# Patient Record
Sex: Male | Born: 1955 | Hispanic: Yes | Marital: Single | Smoking: Current every day smoker
Health system: Southern US, Community
[De-identification: ages and names within clinical notes are randomized; demographics above are authoritative.]

## PROBLEM LIST (undated history)

## (undated) DIAGNOSIS — R7303 Prediabetes: Secondary | ICD-10-CM

---

## 1993-02-10 HISTORY — PX: MENISCUS REPAIR: SHX5179

## 2012-02-11 HISTORY — PX: CARPAL TUNNEL RELEASE: SHX101

## 2017-04-28 ENCOUNTER — Other Ambulatory Visit: Payer: Self-pay | Admitting: Orthopedic Surgery

## 2017-04-28 DIAGNOSIS — M25561 Pain in right knee: Secondary | ICD-10-CM

## 2017-06-08 ENCOUNTER — Other Ambulatory Visit: Payer: Self-pay

## 2017-06-08 ENCOUNTER — Encounter: Payer: Self-pay | Admitting: Emergency Medicine

## 2017-06-08 ENCOUNTER — Ambulatory Visit
Admission: RE | Admit: 2017-06-08 | Discharge: 2017-06-08 | Disposition: A | Discharge status: Home / Self Care | Payer: Self-pay | Source: Ambulatory Visit | Attending: Orthopedic Surgery | Admitting: Orthopedic Surgery

## 2017-06-08 ENCOUNTER — Emergency Department
Admission: EM | Admit: 2017-06-08 | Discharge: 2017-06-09 | Disposition: A | Discharge status: Home / Self Care | Payer: Self-pay | Attending: Emergency Medicine | Admitting: Emergency Medicine

## 2017-06-08 ENCOUNTER — Emergency Department: Payer: Self-pay

## 2017-06-08 DIAGNOSIS — R1013 Epigastric pain: Secondary | ICD-10-CM | POA: Insufficient documentation

## 2017-06-08 DIAGNOSIS — F17210 Nicotine dependence, cigarettes, uncomplicated: Secondary | ICD-10-CM | POA: Insufficient documentation

## 2017-06-08 DIAGNOSIS — S83231A Complex tear of medial meniscus, current injury, right knee, initial encounter: Secondary | ICD-10-CM | POA: Insufficient documentation

## 2017-06-08 DIAGNOSIS — S83411A Sprain of medial collateral ligament of right knee, initial encounter: Secondary | ICD-10-CM | POA: Insufficient documentation

## 2017-06-08 DIAGNOSIS — M25561 Pain in right knee: Secondary | ICD-10-CM | POA: Insufficient documentation

## 2017-06-08 DIAGNOSIS — R110 Nausea: Secondary | ICD-10-CM | POA: Insufficient documentation

## 2017-06-08 DIAGNOSIS — K859 Acute pancreatitis without necrosis or infection, unspecified: Secondary | ICD-10-CM | POA: Insufficient documentation

## 2017-06-08 HISTORY — DX: Prediabetes: R73.03

## 2017-06-08 LAB — HEPATIC FUNCTION PANEL
ALBUMIN: 4 g/dL (ref 3.5–5.0)
ALT: 18 U/L (ref 17–63)
AST: 30 U/L (ref 15–41)
Alkaline Phosphatase: 66 U/L (ref 38–126)
TOTAL PROTEIN: 7.3 g/dL (ref 6.5–8.1)
Total Bilirubin: 0.5 mg/dL (ref 0.3–1.2)

## 2017-06-08 LAB — CBC
HCT: 42.8 % (ref 40.0–52.0)
HEMOGLOBIN: 14.9 g/dL (ref 13.0–18.0)
MCH: 34.5 pg — ABNORMAL HIGH (ref 26.0–34.0)
MCHC: 34.8 g/dL (ref 32.0–36.0)
MCV: 99 fL (ref 80.0–100.0)
PLATELETS: 295 10*3/uL (ref 150–440)
RBC: 4.33 MIL/uL — AB (ref 4.40–5.90)
RDW: 14.4 % (ref 11.5–14.5)
WBC: 9.7 10*3/uL (ref 3.8–10.6)

## 2017-06-08 LAB — BASIC METABOLIC PANEL
ANION GAP: 9 (ref 5–15)
BUN: 16 mg/dL (ref 6–20)
CALCIUM: 8.5 mg/dL — AB (ref 8.9–10.3)
CO2: 26 mmol/L (ref 22–32)
CREATININE: 0.77 mg/dL (ref 0.61–1.24)
Chloride: 104 mmol/L (ref 101–111)
Glucose, Bld: 108 mg/dL — ABNORMAL HIGH (ref 65–99)
Potassium: 4 mmol/L (ref 3.5–5.1)
SODIUM: 139 mmol/L (ref 135–145)

## 2017-06-08 LAB — TROPONIN I

## 2017-06-08 LAB — LIPASE, BLOOD: Lipase: 58 U/L — ABNORMAL HIGH (ref 11–51)

## 2017-06-08 MED ORDER — GI COCKTAIL ~~LOC~~
30.0000 mL | Freq: Once | ORAL | Status: AC
Start: 2017-06-08 — End: 2017-06-08
  Administered 2017-06-08: 30 mL via ORAL
  Filled 2017-06-08: qty 30

## 2017-06-08 NOTE — ED Notes (Signed)
Pt had an MRI done around 3:00 and around 5:00pm he started having nausea and chest pain on left side. Pt states that his heart feels "really warm". Family at bedside. Pt alert and oriented x 4.Pt is spanish speaking, but family at bedside speaks [place].

## 2017-06-08 NOTE — ED Provider Notes (Signed)
[facility] Emergency Department Provider Note  Time seen: 8:27 PM  I have reviewed the triage vital signs and the nursing notes.   HISTORY  Chief Complaint Chest Pain and Nausea    HPI Joel Lutz is a 62 y.o. male with no past medical history who presents to the emergency department today for chest discomfort.  According to the patient he had an MRI performed of the knee around 3 PM today (MRI was without contrast) shortly afterwards he developed a burning sensation to the left chest with occasional sharp pains.  Also states epigastric pain and nausea but denies any vomiting diaphoresis or shortness of breath.  Patient states he has had similar pains in the past but never with this sharp pain which is what concerned him so he came to the emergency department for evaluation.   Past Medical History:  Diagnosis Date  . Pre-diabetes     There are no active problems to display for this patient.   History reviewed. No pertinent surgical history.  Prior to Admission medications   Not on File    No Known Allergies  No family history on file.  Social History Social History   Tobacco Use  . Smoking status: Current Every Day Smoker    Packs/day: 0.50    Types: Cigarettes  . Smokeless tobacco: Never Used  Substance Use Topics  . Alcohol use: Never    Frequency: Never  . Drug use: Never    Review of Systems Constitutional: Negative for fever. Eyes: Negative for visual complaints ENT: Negative for recent illness/congestion Cardiovascular: Mild chest burning Respiratory: Negative for shortness of breath. Gastrointestinal: Positive for epigastric burning.  Positive for nausea.  Negative for vomiting or diarrhea Genitourinary: Negative for urinary compaints Musculoskeletal: Negative for musculoskeletal complaints Skin: Negative for skin complaints  Neurological: Negative for headache All other ROS  negative  ____________________________________________   PHYSICAL EXAM:  VITAL SIGNS: ED Triage Vitals  Enc Vitals Group     BP 06/08/17 1942 129/70     Pulse Rate 06/08/17 1942 60     Resp 06/08/17 1942 18     Temp 06/08/17 1942 98.3 F (36.8 C)     Temp Source 06/08/17 1942 Oral     SpO2 06/08/17 1942 99 %     Weight 06/08/17 1940 145 lb (65.8 kg)     Height 06/08/17 1940  (1.651 m)     Head Circumference --      Peak Flow --      Pain Score 06/08/17 1939 8     Pain Loc --      Pain Edu? --      Excl. in GC? --     Constitutional: Alert. Well appearing and in no distress. Eyes: Normal exam ENT   Head: Normocephalic and atraumatic.   Nose: No congestion/rhinnorhea.   Mouth/Throat: Mucous membranes are moist. Cardiovascular: Normal rate, regular rhythm. No murmurs, rubs, or gallops. Respiratory: Normal respiratory effort without tachypnea nor retractions. Breath sounds are clear and equal bilaterally. No wheezes/rales/rhonchi. Gastrointestinal: Soft, moderate epigastric tenderness.  Abdomen otherwise benign.  No right upper quadrant tenderness.  No rebound or guarding.  No distention. Musculoskeletal: Nontender with normal range of motion in all extremities. No lower extremity tenderness or edema. Neurologic:  No gross focal neurologic deficits are appreciated. Skin:  Skin is warm, dry and intact.  Psychiatric: Mood and affect are normal. Speech and behavior are normal.   ____________________________________________    EKG  EKG  reviewed and interpreted by myself shows normal sinus rhythm at 60 bpm with a narrow QRS, normal axis, normal intervals, no ST changes.  Normal EKG.  ____________________________________________    RADIOLOGY  Chest x-ray negative  ____________________________________________   INITIAL IMPRESSION / ASSESSMENT AND PLAN / ED COURSE  Pertinent labs & imaging results that were available during my care of the patient were  reviewed by me and considered in my medical decision making (see chart for details).  Patient presents to the emergency department for chest burning and epigastric pain with nausea.  On exam patient has moderate epigastric tenderness, largely benign abdomen otherwise.  No rebound or guarding or distention.  Patient denies alcohol use.  Differential would include pancreatitis, ACS, gastric reflux, gastritis or esophagitis, gallbladder disease or hepatitis.  We will check labs, chest x-ray and EKG.  Labs are largely reassuring including negative troponin, x-ray is normal.  EKG is normal.  I have added on a hepatic function panel as well as a lipase.  We will dose a GI cocktail and continue to closely monitor.  Patient agreeable to this plan of care.  We will order a repeat troponin to be drawn 3 hours after initial troponin.  Second troponin is negative.  Lipase is slightly elevated which would explain his epigastric discomfort and burning sensation.  Patient does state somewhat better after GI cocktail but still remains present.  We will discharge with Norco, instructions for clear liquid diet for 2 days followed by low-fat diet for 7 days.  Patient agreeable to plan discussed return precautions as well. ____________________________________________   FINAL CLINICAL IMPRESSION(S) / ED DIAGNOSES  Chest pain Epigastric pain Pancreatitis   Minna Antis, MD 06/09/17 1191

## 2017-06-08 NOTE — ED Triage Notes (Signed)
Pt presents to ED with burning in his chest and nausea with intermittent sharp pains. Onset of symptoms started just after MRI was performed around 1700 today. Pt alert and able to answer questions without difficulty. No distress noted.

## 2017-06-09 LAB — TROPONIN I

## 2017-06-09 MED ORDER — HYDROCODONE-ACETAMINOPHEN 5-325 MG PO TABS: 1.0000 | ORAL_TABLET | ORAL | 0 refills | Status: DC | PRN

## 2017-06-09 NOTE — Discharge Instructions (Signed)
Has been discussed please adhere to a clear liquid diet for the next 2 days.  Then adhere to a very low-fat diet for the next 7 days.  Return to the emergency department for any worsening pain, fever, recurrence of chest pain, or any other symptoms personally concerning to self.  Otherwise please follow-up with your doctor within the next 2 days for recheck/reevaluation.  Please take your pain medication as needed but only as prescribed.

## 2017-06-29 ENCOUNTER — Other Ambulatory Visit: Payer: Self-pay | Admitting: Orthopedic Surgery

## 2017-06-30 ENCOUNTER — Inpatient Hospital Stay: Admission: RE | Admit: 2017-06-30 | Payer: Self-pay | Source: Ambulatory Visit

## 2017-07-01 ENCOUNTER — Encounter
Admission: RE | Admit: 2017-07-01 | Discharge: 2017-07-01 | Disposition: A | Discharge status: Home / Self Care | Payer: Self-pay | Source: Ambulatory Visit | Attending: Orthopedic Surgery | Admitting: Orthopedic Surgery

## 2017-07-01 ENCOUNTER — Other Ambulatory Visit: Payer: Self-pay

## 2017-07-01 DIAGNOSIS — Z01812 Encounter for preprocedural laboratory examination: Secondary | ICD-10-CM | POA: Insufficient documentation

## 2017-07-01 LAB — PROTIME-INR
INR: 0.87
Prothrombin Time: 11.7 seconds (ref 11.4–15.2)

## 2017-07-01 LAB — APTT: APTT: 31 s (ref 24–36)

## 2017-07-01 NOTE — Patient Instructions (Signed)
Your procedure is scheduled on: Tuesday 07/07/17 Su procedimiento est programado para: Report to  Presntese a: To find out your arrival time please call [phone] between 1PM - 3PM on Friday 07/02/17. Para saber su hora de llegada por favor llame al [phone] entre la 1PM - 3PM el da:  Remember: Instructions that are not followed completely may result in serious medical risk, up to and including death, or upon the discretion of your surgeon and anesthesiologist your surgery may need to be rescheduled.  Recuerde: Las instrucciones que no se siguen completamente Armed forces logistics/support/administrative officer en un riesgo de salud grave, incluyendo hasta la [place] o a discrecin de su cirujano y Scientific laboratory technician, su ciruga se puede posponer.   __X__ 1. Do not eat food or drink liquids after midnight. No gum chewing or hard candies.  No coma alimentos ni tome lquidos despus de la medianoche.  No mastique chicle ni caramelos  duros.     __X__ 2. No alcohol for 24 hours before or after surgery.    No tome alcohol durante las 24 horas antes ni despus de la [place].   __X__ 3. Bring all medications with you on the day of surgery if instructed.    Lleve todos los medicamentos con usted el da de su ciruga si se le ha indicado as.   __X__ 4. Notify your doctor if there is any change in your medical condition (cold, fever,                             infections).    Informe a su mdico si hay algn cambio en su condicin mdica (resfriado, fiebre, infecciones).   Do not wear jewelry, make-up, hairpins, clips or nail polish.  No use joyas, maquillajes, pinzas/ganchos para el cabello ni esmalte de uas.  Do not wear lotions, powders, or perfumes. .  No use lociones, polvos o perfumes.  .    Do not shave 48 hours prior to surgery. Men may shave face and neck.  No se afeite 48 horas antes de la [place].  Los hombres pueden [employer] cara y el cuello.   Do not bring valuables to the hospital.   No lleve objetos de  valor al hospital.  [facility] is not responsible for any belongings or valuables.   no se hace responsable de ningn tipo de pertenencias u objetos de Licensed conveyancer.               Contacts, dentures or bridgework may not be worn into surgery.  Los lentes de [place], las dentaduras postizas o puentes no se pueden usar en la [place].  Leave your suitcase in the car. After surgery it may be brought to your room.  Deje su maleta en el auto.  Despus de la ciruga podr traerla a su habitacin.  For patients admitted to the hospital, discharge time is determined by your treatment team.  Para los pacientes que sean ingresados al hospital, el tiempo en el cual se le dar de alta es determinado por su                equipo de [place].   Patients discharged the day of surgery will not be allowed to drive home. A los pacientes que se les da de alta el mismo da de la ciruga no se les permitir conducir a Higher education careers adviser.   Please read over the following fact sheets that you were given: Por favor lea  las siguientes hojas de informacin que le dieron:   MRSA Information   __X__ Take these medicines the morning of surgery with A SIP OF WATER:          [employer] estas medicinas la maana de la ciruga con UN SORBO DE AGUA:  1. NONE  2.   3.   4.       5.  6.  ____ Fleet Enema (as directed)          Enema de Fleet (segn lo indicado)    __X__ Use CHG Soap as directed          Utilice el jabn de CHG segn lo indicado  ____ Use inhalers on the day of surgery          Use los inhaladores el da de la ciruga  ____ Stop metformin 2 days prior to surgery          Deje de tomar el metformin 2 das antes de la ciruga    ____ Take 1/2 of usual insulin dose the night before surgery and none on the morning of surgery           Tome la mitad de la dosis habitual de insulina la noche antes de la [place] y no tome nada en la maana de la             ciruga  ____ Stop Coumadin/Plavix/aspirin on            Deje de tomar el Coumadin/Plavix/aspirina el da:  ____ Stop Anti-inflammatories on           Deje de tomar antiinflamatorios el da:   ____ Stop supplements until after surgery            Deje de tomar suplementos hasta despus de la ciruga  ____ Bring C-Pap to the hospital          Lleve el C-Pap al hospital

## 2017-07-06 MED ORDER — CEFAZOLIN SODIUM-DEXTROSE 2-4 GM/100ML-% IV SOLN
2.0000 g | INTRAVENOUS | Status: AC
  Administered 2017-07-07: 2 g via INTRAVENOUS

## 2017-07-07 ENCOUNTER — Ambulatory Visit
Admission: RE | Admit: 2017-07-07 | Discharge: 2017-07-07 | Disposition: A | Discharge status: Home / Self Care | Payer: Self-pay | Source: Ambulatory Visit | Attending: Orthopedic Surgery | Admitting: Orthopedic Surgery

## 2017-07-07 ENCOUNTER — Ambulatory Visit: Payer: Self-pay | Admitting: Anesthesiology

## 2017-07-07 ENCOUNTER — Encounter
Admission: RE | Disposition: A | Discharge status: Home / Self Care | Payer: Self-pay | Source: Ambulatory Visit | Attending: Orthopedic Surgery

## 2017-07-07 ENCOUNTER — Encounter: Payer: Self-pay | Admitting: *Deleted

## 2017-07-07 DIAGNOSIS — X58XXXA Exposure to other specified factors, initial encounter: Secondary | ICD-10-CM | POA: Insufficient documentation

## 2017-07-07 DIAGNOSIS — S83241A Other tear of medial meniscus, current injury, right knee, initial encounter: Secondary | ICD-10-CM | POA: Insufficient documentation

## 2017-07-07 DIAGNOSIS — F17210 Nicotine dependence, cigarettes, uncomplicated: Secondary | ICD-10-CM | POA: Insufficient documentation

## 2017-07-07 HISTORY — PX: KNEE ARTHROSCOPY WITH MEDIAL MENISECTOMY: SHX5651

## 2017-07-07 LAB — GLUCOSE, CAPILLARY
GLUCOSE-CAPILLARY: 94 mg/dL (ref 65–99)
Glucose-Capillary: 111 mg/dL — ABNORMAL HIGH (ref 65–99)

## 2017-07-07 SURGERY — ARTHROSCOPY, KNEE, WITH MEDIAL MENISCECTOMY
Anesthesia: General | Site: Knee | Laterality: Right | Wound class: Clean

## 2017-07-07 MED ORDER — CHLORHEXIDINE GLUCONATE CLOTH 2 % EX PADS: 6.0000 | MEDICATED_PAD | Freq: Once | CUTANEOUS | Status: DC

## 2017-07-07 MED ORDER — MIDAZOLAM HCL 2 MG/2ML IJ SOLN
INTRAMUSCULAR | Status: AC
  Filled 2017-07-07: qty 2

## 2017-07-07 MED ORDER — HYDROCODONE-ACETAMINOPHEN 5-325 MG PO TABS
1.0000 | ORAL_TABLET | Freq: Four times a day (QID) | ORAL | Status: DC | PRN
  Administered 2017-07-07: 1 via ORAL

## 2017-07-07 MED ORDER — LIDOCAINE HCL (PF) 1 % IJ SOLN
INTRAMUSCULAR | Status: DC | PRN
  Administered 2017-07-07: 5 mL

## 2017-07-07 MED ORDER — DEXAMETHASONE SODIUM PHOSPHATE 10 MG/ML IJ SOLN
INTRAMUSCULAR | Status: DC | PRN
  Administered 2017-07-07: 5 mg via INTRAVENOUS

## 2017-07-07 MED ORDER — IPRATROPIUM-ALBUTEROL 0.5-2.5 (3) MG/3ML IN SOLN
3.0000 mL | Freq: Once | RESPIRATORY_TRACT | Status: AC
  Administered 2017-07-07: 3 mL via RESPIRATORY_TRACT

## 2017-07-07 MED ORDER — ONDANSETRON HCL 4 MG/2ML IJ SOLN
INTRAMUSCULAR | Status: DC | PRN
  Administered 2017-07-07: 4 mg via INTRAVENOUS

## 2017-07-07 MED ORDER — ACETAMINOPHEN 10 MG/ML IV SOLN
INTRAVENOUS | Status: DC | PRN
  Administered 2017-07-07: 1000 mg via INTRAVENOUS

## 2017-07-07 MED ORDER — FENTANYL CITRATE (PF) 100 MCG/2ML IJ SOLN
INTRAMUSCULAR | Status: AC
  Filled 2017-07-07: qty 2

## 2017-07-07 MED ORDER — ONDANSETRON HCL 4 MG/2ML IJ SOLN: 4.0000 mg | Freq: Once | INTRAMUSCULAR | Status: DC | PRN

## 2017-07-07 MED ORDER — PHENYLEPHRINE HCL 10 MG/ML IJ SOLN
INTRAMUSCULAR | Status: AC
  Filled 2017-07-07: qty 1

## 2017-07-07 MED ORDER — PROPOFOL 10 MG/ML IV BOLUS
INTRAVENOUS | Status: AC
  Filled 2017-07-07: qty 20

## 2017-07-07 MED ORDER — ACETAMINOPHEN 10 MG/ML IV SOLN
INTRAVENOUS | Status: AC
  Filled 2017-07-07: qty 100

## 2017-07-07 MED ORDER — LIDOCAINE HCL (CARDIAC) PF 100 MG/5ML IV SOSY
PREFILLED_SYRINGE | INTRAVENOUS | Status: DC | PRN
  Administered 2017-07-07: 100 mg via INTRAVENOUS

## 2017-07-07 MED ORDER — LACTATED RINGERS IV SOLN
INTRAVENOUS | Status: DC
  Administered 2017-07-07: 75 mL/h via INTRAVENOUS

## 2017-07-07 MED ORDER — BUPIVACAINE-EPINEPHRINE (PF) 0.25% -1:200000 IJ SOLN
INTRAMUSCULAR | Status: DC | PRN
  Administered 2017-07-07: 20 mL via PERINEURAL

## 2017-07-07 MED ORDER — BUPIVACAINE-EPINEPHRINE (PF) 0.25% -1:200000 IJ SOLN
INTRAMUSCULAR | Status: AC
  Filled 2017-07-07: qty 30

## 2017-07-07 MED ORDER — IPRATROPIUM-ALBUTEROL 0.5-2.5 (3) MG/3ML IN SOLN
RESPIRATORY_TRACT | Status: AC
  Filled 2017-07-07: qty 3

## 2017-07-07 MED ORDER — MIDAZOLAM HCL 2 MG/2ML IJ SOLN
INTRAMUSCULAR | Status: DC | PRN
  Administered 2017-07-07: 2 mg via INTRAVENOUS

## 2017-07-07 MED ORDER — SUGAMMADEX SODIUM 200 MG/2ML IV SOLN
INTRAVENOUS | Status: DC | PRN
  Administered 2017-07-07: 200 mg via INTRAVENOUS

## 2017-07-07 MED ORDER — ASPIRIN EC 325 MG PO TBEC: 325.0000 mg | DELAYED_RELEASE_TABLET | Freq: Every day | ORAL | 0 refills | Status: AC

## 2017-07-07 MED ORDER — SUCCINYLCHOLINE CHLORIDE 20 MG/ML IJ SOLN
INTRAMUSCULAR | Status: AC
  Filled 2017-07-07: qty 1

## 2017-07-07 MED ORDER — FAMOTIDINE 20 MG PO TABS
20.0000 mg | ORAL_TABLET | Freq: Once | ORAL | Status: AC
  Administered 2017-07-07: 20 mg via ORAL

## 2017-07-07 MED ORDER — DEXAMETHASONE SODIUM PHOSPHATE 10 MG/ML IJ SOLN
INTRAMUSCULAR | Status: AC
  Filled 2017-07-07: qty 1

## 2017-07-07 MED ORDER — ROCURONIUM BROMIDE 100 MG/10ML IV SOLN
INTRAVENOUS | Status: DC | PRN
  Administered 2017-07-07: 30 mg via INTRAVENOUS
  Administered 2017-07-07: 20 mg via INTRAVENOUS

## 2017-07-07 MED ORDER — SUGAMMADEX SODIUM 200 MG/2ML IV SOLN
INTRAVENOUS | Status: AC
  Filled 2017-07-07: qty 2

## 2017-07-07 MED ORDER — PHENYLEPHRINE HCL 10 MG/ML IJ SOLN
INTRAMUSCULAR | Status: DC | PRN
  Administered 2017-07-07 (×5): 100 ug via INTRAVENOUS

## 2017-07-07 MED ORDER — SUCCINYLCHOLINE CHLORIDE 20 MG/ML IJ SOLN
INTRAMUSCULAR | Status: DC | PRN
  Administered 2017-07-07: 100 mg via INTRAVENOUS

## 2017-07-07 MED ORDER — FENTANYL CITRATE (PF) 100 MCG/2ML IJ SOLN
INTRAMUSCULAR | Status: DC | PRN
  Administered 2017-07-07 (×2): 50 ug via INTRAVENOUS

## 2017-07-07 MED ORDER — ROCURONIUM BROMIDE 50 MG/5ML IV SOLN
INTRAVENOUS | Status: AC
  Filled 2017-07-07: qty 1

## 2017-07-07 MED ORDER — HYDROCODONE-ACETAMINOPHEN 5-325 MG PO TABS: 1.0000 | ORAL_TABLET | Freq: Four times a day (QID) | ORAL | 0 refills | Status: AC | PRN

## 2017-07-07 MED ORDER — LIDOCAINE HCL (PF) 2 % IJ SOLN
INTRAMUSCULAR | Status: AC
  Filled 2017-07-07: qty 10

## 2017-07-07 MED ORDER — ONDANSETRON HCL 4 MG PO TABS: 4.0000 mg | ORAL_TABLET | Freq: Three times a day (TID) | ORAL | 0 refills | Status: AC | PRN

## 2017-07-07 MED ORDER — HYDROCODONE-ACETAMINOPHEN 5-325 MG PO TABS
ORAL_TABLET | ORAL | Status: AC
  Filled 2017-07-07: qty 1

## 2017-07-07 MED ORDER — ONDANSETRON HCL 4 MG/2ML IJ SOLN
INTRAMUSCULAR | Status: AC
  Filled 2017-07-07: qty 2

## 2017-07-07 MED ORDER — LIDOCAINE HCL (PF) 1 % IJ SOLN
INTRAMUSCULAR | Status: AC
  Filled 2017-07-07: qty 30

## 2017-07-07 MED ORDER — FAMOTIDINE 20 MG PO TABS
ORAL_TABLET | ORAL | Status: AC
  Administered 2017-07-07: 20 mg via ORAL
  Filled 2017-07-07: qty 1

## 2017-07-07 MED ORDER — FENTANYL CITRATE (PF) 100 MCG/2ML IJ SOLN: 25.0000 ug | INTRAMUSCULAR | Status: DC | PRN

## 2017-07-07 MED ORDER — PROPOFOL 10 MG/ML IV BOLUS
INTRAVENOUS | Status: DC | PRN
  Administered 2017-07-07: 80 mg via INTRAVENOUS
  Administered 2017-07-07: 120 mg via INTRAVENOUS

## 2017-07-07 SURGICAL SUPPLY — 32 items
BLADE SURG SZ10 CARB STEEL (BLADE) ×3 IMPLANT
BUR RADIUS 3.5 (BURR) ×3 IMPLANT
BUR RADIUS 4.0X18.5 (BURR) ×3 IMPLANT
CLOSURE WOUND 1/2 X4 (GAUZE/BANDAGES/DRESSINGS) ×1
CUFF TOURN 24 STER (MISCELLANEOUS) ×3 IMPLANT
DRAPE IMP U-DRAPE 54X76 (DRAPES) ×3 IMPLANT
DURAPREP 26ML APPLICATOR (WOUND CARE) ×9 IMPLANT
GAUZE PETRO XEROFOAM 1X8 (MISCELLANEOUS) ×3 IMPLANT
GAUZE SPONGE 4X4 12PLY STRL (GAUZE/BANDAGES/DRESSINGS) ×3 IMPLANT
GLOVE BIOGEL PI IND STRL 9 (GLOVE) ×1 IMPLANT
GLOVE BIOGEL PI INDICATOR 9 (GLOVE) ×2
GLOVE SURG 9.0 ORTHO LTXF (GLOVE) ×6 IMPLANT
GOWN STRL REUS W/ TWL LRG LVL3 (GOWN DISPOSABLE) ×1 IMPLANT
GOWN STRL REUS W/TWL 2XL LVL3 (GOWN DISPOSABLE) ×3 IMPLANT
GOWN STRL REUS W/TWL LRG LVL3 (GOWN DISPOSABLE) ×2
IV LACTATED RINGER IRRG 3000ML (IV SOLUTION) ×12
IV LR IRRIG 3000ML ARTHROMATIC (IV SOLUTION) ×6 IMPLANT
KIT TURNOVER KIT A (KITS) ×3 IMPLANT
MANIFOLD NEPTUNE II (INSTRUMENTS) ×3 IMPLANT
MAT GRAY ABSORB FLUID 28X50 (MISCELLANEOUS) ×3 IMPLANT
PACK ARTHROSCOPY KNEE (MISCELLANEOUS) ×3 IMPLANT
PAD ABD DERMACEA PRESS 5X9 (GAUZE/BANDAGES/DRESSINGS) ×6 IMPLANT
SET TUBE SUCT SHAVER OUTFL 24K (TUBING) ×3 IMPLANT
SOL PREP PVP 2OZ (MISCELLANEOUS) ×3
SOLUTION PREP PVP 2OZ (MISCELLANEOUS) ×1 IMPLANT
STRIP CLOSURE SKIN 1/2X4 (GAUZE/BANDAGES/DRESSINGS) ×2 IMPLANT
SUT ETHILON 4-0 (SUTURE) ×2
SUT ETHILON 4-0 FS2 18XMFL BLK (SUTURE) ×1
SUTURE ETHLN 4-0 FS2 18XMF BLK (SUTURE) ×1 IMPLANT
TUBING ARTHRO INFLOW-ONLY STRL (TUBING) ×3 IMPLANT
WAND HAND CNTRL MULTIVAC 50 (MISCELLANEOUS) ×3 IMPLANT
WAND HAND CNTRL MULTIVAC 90 (MISCELLANEOUS) ×3 IMPLANT

## 2017-07-07 NOTE — OR Nursing (Addendum)
Patient states he has one loose tooth on top of mouth. Denies any broken teeth. Preop interpretation provided by [facility].

## 2017-07-07 NOTE — Anesthesia Procedure Notes (Signed)
Procedure Name: Intubation Date/Time: 07/07/2017 8:01 AM Performed by: Aline Brochure, CRNA Pre-anesthesia Checklist: Patient identified, Emergency Drugs available, Suction available and Patient being monitored Patient Re-evaluated:Patient Re-evaluated prior to induction Oxygen Delivery Method: Circle system utilized Preoxygenation: Pre-oxygenation with 100% oxygen Induction Type: IV induction Ventilation: Mask ventilation without difficulty Laryngoscope Size: Mac and 4 Grade View: Grade II Tube type: Oral Tube size: 7.5 mm Number of attempts: 1 Placement Confirmation: ETT inserted through vocal cords under direct vision,  positive ETCO2 and breath sounds checked- equal and bilateral Secured at: 23 cm Tube secured with: Tape Dental Injury: Teeth and Oropharynx as per pre-operative assessment  Difficulty Due To: Difficult Airway- due to dentition

## 2017-07-07 NOTE — Transfer of Care (Signed)
Immediate Anesthesia Transfer of Care Note  Patient: Joel Lutz  Procedure(s) Performed: KNEE ARTHROSCOPY WITH MEDIAL MENISECTOMY (Right Knee)  Patient Location: PACU  Anesthesia Type:General  Level of Consciousness: awake  Airway & Oxygen Therapy: Patient connected to face mask oxygen  Post-op Assessment: Post -op Vital signs reviewed and stable  Post vital signs: stable  Last Vitals:  Vitals Value Taken Time  BP 129/73 07/07/2017  9:37 AM  Temp    Pulse 91 07/07/2017  9:37 AM  Resp 20 07/07/2017  9:37 AM  SpO2 100 % 07/07/2017  9:37 AM    Last Pain:  Vitals:   07/07/17 0619  TempSrc: Tympanic  PainSc: 4       Patients Stated Pain Goal: 1 (07/07/17 1610)  Complications: No apparent anesthesia complications

## 2017-07-07 NOTE — Op Note (Signed)
  PATIENT:  Joel Lutz  PRE-OPERATIVE DIAGNOSIS:  TEAR OF MEDIAL MENISCUS, RIGHT KNEE  POST-OPERATIVE DIAGNOSIS:  Same  PROCEDURE:  RIGHT KNEE ARTHROSCOPY WITH  Partial MEDIAL MENISECTOMY, synovectomy  SURGEON:  Thornton Park, MD  ANESTHESIA:   General  PREOPERATIVE INDICATIONS:  Joel Lutz  62 y.o. male with a diagnosis of [place] has elected for surgical management.    The risks benefits and alternatives were discussed with the patient preoperatively including the risks of infection, bleeding, nerve injury, knee stiffness, persistent pain, osteoarthritis and the need for further surgery. Medical  risks include DVT and pulmonary embolism, myocardial infarction, stroke, pneumonia, respiratory failure and death. The patient understood these risks and wished to proceed.  OPERATIVE FINDINGS: Undersurface tear of the posterior medial meniscus  OPERATIVE PROCEDURE: Patient was met in the preoperative area. The operative extremity was signed with the word yes and my initials according the hospital's correct site of surgery protocol.  A pre-op H&P was performed at the bedside this AM, with the patient's family and hospital interpreter at the bedside.  The patient was brought to the operating room where they was placed supine on the operative table. General anesthesia was administered. The patient was prepped and draped in a sterile fashion.  A timeout was performed to verify the patient's name, date of birth, medical record number, correct site of surgery correct procedure to be performed. It was also used to verify the patient received antibiotics that all appropriate instruments, and radiographic studies were available in the room. Once all in attendance were in agreement, the case began.  Proposed arthroscopy incisions were drawn out with a surgical marker. These were pre-injected with 1% lidocaine plain. An 11 blade was used to establish an  inferior lateral and inferomedial portals. The inferomedial portal was created using a 18-gauge spinal needle under direct visualization.  A full diagnostic examination of the knee was performed including the suprapatellar pouch, patellofemoral joint, medial lateral compartments as well as the medial lateral gutters, the intercondylar notch in the posterior knee.  Patient had the medial meniscal tear treated with a 4.0 resector shaver blade and straight duckbill basket. The undersurface of the meniscal tear was debrided leaving a stable articular surface.  A partial synovectomy was also performed using a 4.0 resector shaver blade and 90 ArthroCare wand.  The knee was then copiously lavaged. All arthroscopic instruments were removed. The two arthroscopy portals were closed with 4.0 nylon. Steri-Strips were applied along with a dry sterile and compressive dressing. The patient was brought to the PACU in stable condition. I was scrubbed and present for the entire case and all sharp and instrument counts were correct at the conclusion the case. I spoke with the patient's family postoperatively to let them know the case was performed without complication and the patient was stable in the recovery room.   Timoteo Gaul, MD

## 2017-07-07 NOTE — Anesthesia Postprocedure Evaluation (Signed)
Anesthesia Post Note  Patient: Eliezer Khawaja  Procedure(s) Performed: KNEE ARTHROSCOPY WITH MEDIAL MENISECTOMY (Right Knee)  Patient location during evaluation: PACU Anesthesia Type: General Level of consciousness: awake and alert and oriented Pain management: pain level controlled Vital Signs Assessment: post-procedure vital signs reviewed and stable Respiratory status: spontaneous breathing Cardiovascular status: blood pressure returned to baseline Anesthetic complications: no Comments: Sister admits that the patient is just getting over a cold after previously denying any recent cold preop     Last Vitals:  Vitals:   07/07/17 0950 07/07/17 1005  BP: 133/76 (!) 142/80  Pulse: 90 83  Resp: 10 12  Temp:    SpO2: 94% 93%    Last Pain:  Vitals:   07/07/17 1006  TempSrc:   PainSc: 0-No pain                 Melonee Gerstel

## 2017-07-07 NOTE — H&P (Signed)
PREOPERATIVE H&P  Chief Complaint: tear of meniscus of right knee  HPI: Joel Lutz is a 62 y.o. male who presents for preoperative history and physical with a diagnosis of tear of medial meniscus of right knee. Symptoms are rated as moderate, and have been worsening.  This is significantly impairing activities of daily living.  He has elected for surgical management.   Past Medical History:  Diagnosis Date  . Pre-diabetes    Past Surgical History:  Procedure Laterality Date  . CARPAL TUNNEL RELEASE Right 2014  . MENISCUS REPAIR Left 1995   Social History   Socioeconomic History  . Marital status: Single    Spouse name: Not on file  . Number of children: Not on file  . Years of education: Not on file  . Highest education level: Not on file  Occupational History  . Not on file  Social Needs  . Financial resource strain: Not on file  . Food insecurity:    Worry: Not on file    Inability: Not on file  . Transportation needs:    Medical: Not on file    Non-medical: Not on file  Tobacco Use  . Smoking status: Current Every Day Smoker    Packs/day: 0.25    Types: Cigarettes  . Smokeless tobacco: Never Used  Substance and Sexual Activity  . Alcohol use: Yes    Frequency: Never    Comment: occasional beer  . Drug use: Never  . Sexual activity: Not on file  Lifestyle  . Physical activity:    Days per week: Not on file    Minutes per session: Not on file  . Stress: Not on file  Relationships  . Social connections:    Talks on phone: Not on file    Gets together: Not on file    Attends religious service: Not on file    Active member of club or organization: Not on file    Attends meetings of clubs or organizations: Not on file    Relationship status: Not on file  Other Topics Concern  . Not on file  Social History Narrative  . Not on file   History reviewed. No pertinent family history. No Known Allergies Prior to Admission medications   Not on  File     Positive ROS: All other systems have been reviewed and were otherwise negative with the exception of those mentioned in the HPI and as above.  Physical Exam: General: Alert, no acute distress Cardiovascular: Regular rate and rhythm, no murmurs rubs or gallops.  No pedal edema Respiratory: Clear to auscultation bilaterally, no wheezes rales or rhonchi. No cyanosis, no use of accessory musculature GI: No organomegaly, abdomen is soft and non-tender nondistended with positive bowel sounds. Skin: Skin intact, no lesions within the operative field. Neurologic: Sensation intact distally Psychiatric: Patient is competent for consent with normal mood and affect Lymphatic: No cervical lymphadenopathy  MUSCULOSKELETAL: Right knee:  + medial joint line tenderness,  + Mcmurrays, no ligatmentous laxity.  NVI  Assessment: tear of meniscus of right knee  Plan: Plan for Procedure(s): RIGHT KNEE ARTHROSCOPY WITH MEDIAL MENISECTOMY  I have explained to the patient and his family the details of the operation and the post-op course with the help of the hospital provided interpreter.  I discussed the risks and benefits of surgery. They understand risks include but are not limited to infection, bleeding requiring blood transfusion, nerve or blood vessel injury, joint stiffness or loss of motion, persistent pain, weakness or  instability, and the need for further surgery. Medical risks include but are not limited to DVT and pulmonary embolism, myocardial infarction, stroke, pneumonia, respiratory failure and death. Patient understood these risks and wished to proceed.   Juanell Fairly, MD   07/07/2017 7:44 AM

## 2017-07-07 NOTE — Discharge Instructions (Signed)
CIRUGIA AMBULATORIA °      Instruccionnes de alta ° ° °1.  Las drogas que se le administraron permaneceran en su cuerpo hasta [place], asi       °     que por las proximas 24 horas usted no debe: °  Conducir (manejar) un automovil °  Hacer ninguna decision legal °  Tomar ninguna bebida alcoholica ° °2.  A) [place] puede comenzar una dieta regular.  Es mejor que hoy empiece con  °         liquidos y gradualmente anada comidas solidas. ° °     B) Puede comer cualquier comida que desee pero es mejor empezar con liquidos,             °         luego sopitas con galletas saladas y gradualmente llegar a las comidas solidas. ° °3.  Por favor avise a su medico inmediatamente si usted tiene algun sangrado anormal,  °     tiene dificultad con la respiracion, enrojecimiento y dolor en el sitio de la cirugia, drenaje,  °     fiebro o dolor que se alivia con medicina. ° ° °       ° °5.  Istrucciones especificas :  °

## 2017-07-07 NOTE — Anesthesia Post-op Follow-up Note (Signed)
Anesthesia QCDR form completed.        

## 2017-07-07 NOTE — Anesthesia Preprocedure Evaluation (Signed)
Anesthesia Evaluation  Patient identified by MRN, date of birth, ID band Patient awake    Reviewed: Allergy & Precautions, NPO status , Patient's Chart, lab work & pertinent test results  Airway Mallampati: II  TM Distance: >3 FB     Dental  (+) Loose, Poor Dentition   Pulmonary Current Smoker,    Pulmonary exam normal        Cardiovascular negative cardio ROS Normal cardiovascular exam     Neuro/Psych negative neurological ROS  negative psych ROS   GI/Hepatic negative GI ROS, Neg liver ROS,   Endo/Other  diabetes  Renal/GU negative Renal ROS  negative genitourinary   Musculoskeletal   Abdominal Normal abdominal exam  (+)   Peds negative pediatric ROS (+)  Hematology negative hematology ROS (+)   Anesthesia Other Findings Past Medical History: No date: Pre-diabetes  Reproductive/Obstetrics                             Anesthesia Physical Anesthesia Plan  ASA: II  Anesthesia Plan: General   Post-op Pain Management:    Induction: Intravenous  PONV Risk Score and Plan:   Airway Management Planned: LMA and Oral ETT  Additional Equipment:   Intra-op Plan:   Post-operative Plan: Extubation in OR  Informed Consent: I have reviewed the patients History and Physical, chart, labs and discussed the procedure including the risks, benefits and alternatives for the proposed anesthesia with the patient or authorized representative who has indicated his/her understanding and acceptance.   Dental advisory given  Plan Discussed with: CRNA and Surgeon  Anesthesia Plan Comments:         Anesthesia Quick Evaluation

## 2017-07-08 ENCOUNTER — Encounter: Payer: Self-pay | Admitting: Emergency Medicine

## 2017-07-08 ENCOUNTER — Emergency Department
Admission: EM | Admit: 2017-07-08 | Discharge: 2017-07-08 | Disposition: A | Discharge status: Home / Self Care | Payer: Self-pay | Attending: Emergency Medicine | Admitting: Emergency Medicine

## 2017-07-08 ENCOUNTER — Emergency Department: Payer: Self-pay

## 2017-07-08 ENCOUNTER — Other Ambulatory Visit: Payer: Self-pay

## 2017-07-08 DIAGNOSIS — M791 Myalgia, unspecified site: Secondary | ICD-10-CM | POA: Insufficient documentation

## 2017-07-08 DIAGNOSIS — R079 Chest pain, unspecified: Secondary | ICD-10-CM | POA: Insufficient documentation

## 2017-07-08 DIAGNOSIS — R51 Headache: Secondary | ICD-10-CM | POA: Insufficient documentation

## 2017-07-08 DIAGNOSIS — R109 Unspecified abdominal pain: Secondary | ICD-10-CM | POA: Insufficient documentation

## 2017-07-08 DIAGNOSIS — R070 Pain in throat: Secondary | ICD-10-CM | POA: Insufficient documentation

## 2017-07-08 DIAGNOSIS — G8918 Other acute postprocedural pain: Secondary | ICD-10-CM | POA: Insufficient documentation

## 2017-07-08 DIAGNOSIS — F17210 Nicotine dependence, cigarettes, uncomplicated: Secondary | ICD-10-CM | POA: Insufficient documentation

## 2017-07-08 LAB — COMPREHENSIVE METABOLIC PANEL
ALBUMIN: 4 g/dL (ref 3.5–5.0)
ALT: 18 U/L (ref 17–63)
AST: 36 U/L (ref 15–41)
Alkaline Phosphatase: 68 U/L (ref 38–126)
Anion gap: 7 (ref 5–15)
BILIRUBIN TOTAL: 0.4 mg/dL (ref 0.3–1.2)
BUN: 14 mg/dL (ref 6–20)
CHLORIDE: 104 mmol/L (ref 101–111)
CO2: 27 mmol/L (ref 22–32)
CREATININE: 0.89 mg/dL (ref 0.61–1.24)
Calcium: 8.7 mg/dL — ABNORMAL LOW (ref 8.9–10.3)
GFR calc Af Amer: >60 mL/min (ref >60)
GFR calc non Af Amer: >60 mL/min (ref >60)
GLUCOSE: 97 mg/dL (ref 65–99)
Potassium: 3.8 mmol/L (ref 3.5–5.1)
Sodium: 138 mmol/L (ref 135–145)
TOTAL PROTEIN: 7.5 g/dL (ref 6.5–8.1)

## 2017-07-08 LAB — CBC WITH DIFFERENTIAL/PLATELET
BASOS ABS: 0.1 10*3/uL (ref 0–0.1)
BASOS PCT: 1 %
Eosinophils Absolute: 0 10*3/uL (ref 0–0.7)
Eosinophils Relative: 0 %
HEMATOCRIT: 42 % (ref 40.0–52.0)
HEMOGLOBIN: 14.6 g/dL (ref 13.0–18.0)
LYMPHS PCT: 16 %
Lymphs Abs: 2.4 10*3/uL (ref 1.0–3.6)
MCH: 34.6 pg — ABNORMAL HIGH (ref 26.0–34.0)
MCHC: 34.7 g/dL (ref 32.0–36.0)
MCV: 99.5 fL (ref 80.0–100.0)
Monocytes Absolute: 1.7 10*3/uL — ABNORMAL HIGH (ref 0.2–1.0)
Monocytes Relative: 11 %
NEUTROS ABS: 10.9 10*3/uL — AB (ref 1.4–6.5)
NEUTROS PCT: 72 %
Platelets: 344 10*3/uL (ref 150–440)
RBC: 4.22 MIL/uL — AB (ref 4.40–5.90)
RDW: 13.8 % (ref 11.5–14.5)
WBC: 15.1 10*3/uL — AB (ref 3.8–10.6)

## 2017-07-08 LAB — URINALYSIS, COMPLETE (UACMP) WITH MICROSCOPIC
BACTERIA UA: NONE SEEN
BILIRUBIN URINE: NEGATIVE
Glucose, UA: NEGATIVE mg/dL
KETONES UR: NEGATIVE mg/dL
Leukocytes, UA: NEGATIVE
Nitrite: NEGATIVE
Protein, ur: NEGATIVE mg/dL
SQUAMOUS EPITHELIAL / LPF: NONE SEEN (ref 0–5)
Specific Gravity, Urine: 1.011 (ref 1.005–1.030)
pH: 6 (ref 5.0–8.0)

## 2017-07-08 LAB — TROPONIN I: Troponin I: <0.03 ng/mL (ref <0.03)

## 2017-07-08 MED ORDER — GI COCKTAIL ~~LOC~~
ORAL | Status: AC
  Administered 2017-07-08: 30 mL via ORAL
  Filled 2017-07-08: qty 30

## 2017-07-08 MED ORDER — DOXYCYCLINE HYCLATE 100 MG PO CAPS: 100.0000 mg | ORAL_CAPSULE | Freq: Two times a day (BID) | ORAL | 0 refills | Status: DC

## 2017-07-08 MED ORDER — GI COCKTAIL ~~LOC~~
30.0000 mL | Freq: Once | ORAL | Status: AC
  Administered 2017-07-08: 30 mL via ORAL

## 2017-07-08 NOTE — ED Notes (Signed)
Patient transported to X-ray 

## 2017-07-08 NOTE — ED Notes (Signed)
Interpreter paged.

## 2017-07-08 NOTE — ED Triage Notes (Signed)
Pt comes into the ED via POV c/o generalized body aches, unable to ambulate normal and headache.  Patient had knee surgery yesterday and then he woke up feeling this way.  Patient in NAD at this time.  Patient called his surgeon who told him the symptoms and they said that it was semi normal and as long as he wasn't febrile then its was normal post op complications.  Patient is afebrile at this time and in NAD with even and unlabored respirations.

## 2017-07-08 NOTE — ED Triage Notes (Signed)
Pt here with c/o right knee pain, knee surgery yesterday, states "pain all over" today from "head to toe."

## 2017-07-08 NOTE — ED Provider Notes (Signed)
[facility] Emergency Department Provider Note       Time seen: ----------------------------------------- 3:21 PM on 07/08/2017 -----------------------------------------   I have reviewed the triage vital signs and the nursing notes.  HISTORY   Chief Complaint Generalized Body Aches and Headache    HPI Joel Lutz is a 62 y.o. male with a history of prediabetes who presents to the ED for generalized body aches with diffuse chest and abdominal pain and pain with swallowing.  Patient had knee surgery yesterday to repair a meniscus.  Patient called his surgeon who told him his symptoms were likely normal but he continued to complain of diffuse pain and ill feeling so he was sent to the ER for evaluation.  He denies fever but has chills, has chest pain and cough.  He denies vomiting or diarrhea.  Past Medical History:  Diagnosis Date  . Pre-diabetes     There are no active problems to display for this patient.   Past Surgical History:  Procedure Laterality Date  . CARPAL TUNNEL RELEASE Right 2014  . KNEE ARTHROSCOPY WITH MEDIAL MENISECTOMY Right 07/07/2017   Procedure: KNEE ARTHROSCOPY WITH MEDIAL MENISECTOMY;  Surgeon: Juanell Fairly, MD;  Location: [facility] ORS;  Service: Orthopedics;  Laterality: Right;  . MENISCUS REPAIR Left 1995    Allergies Patient has no known allergies.  Social History Social History   Tobacco Use  . Smoking status: Current Every Day Smoker    Packs/day: 0.25    Types: Cigarettes  . Smokeless tobacco: Never Used  Substance Use Topics  . Alcohol use: Yes    Frequency: Never    Comment: occasional beer  . Drug use: Never   Review of Systems Constitutional: Positive for chills, body aches ENT: Positive for sore throat Cardiovascular: Positive for chest pain Respiratory: Negative for shortness of breath. Gastrointestinal: Positive for abdominal pain Musculoskeletal: Negative for back pain. Skin:  Negative for rash. Neurological: Negative for headaches, focal weakness or numbness.  All systems negative/normal/unremarkable except as stated in the HPI  ____________________________________________   PHYSICAL EXAM:  VITAL SIGNS: ED Triage Vitals  Enc Vitals Group     BP 07/08/17 1334 117/66     Pulse Rate 07/08/17 1334 67     Resp 07/08/17 1334 17     Temp 07/08/17 1334 97.9 F (36.6 C)     Temp Source 07/08/17 1334 Oral     SpO2 07/08/17 1334 97 %     Weight 07/08/17 1331 136 lb (61.7 kg)     Height 07/08/17 1331  (1.651 m)     Head Circumference --      Peak Flow --      Pain Score 07/08/17 1334 8     Pain Loc --      Pain Edu? --      Excl. in [place]? --    Constitutional: Alert and oriented. Well appearing and in no distress. Eyes: Conjunctivae are normal. Normal extraocular movements. ENT   Head: Normocephalic and atraumatic.   Nose: No congestion/rhinnorhea.   Mouth/Throat: Mucous membranes are moist, pharyngeal erythema is noted   Neck: No stridor. Cardiovascular: Normal rate, regular rhythm. No murmurs, rubs, or gallops. Respiratory: Normal respiratory effort without tachypnea nor retractions. Breath sounds are clear and equal bilaterally. No wheezes/rales/rhonchi. Gastrointestinal: Soft and nontender. Normal bowel sounds Musculoskeletal: Right lower extremity is wrapped postoperatively with a cushion wrap covered by Ace wrap.  Underlying incision site is unremarkable. Neurologic:  Normal speech and language. No gross focal  neurologic deficits are appreciated.  Skin:  Skin is warm, dry and intact. No rash noted. Psychiatric: Mood and affect are normal. Speech and behavior are normal.  ____________________________________________  EKG: Interpreted by me.  Sinus bradycardia with rate 56 bpm, normal PR interval, normal QRS, normal QT.  ____________________________________________  ED COURSE:  As part of my medical decision making, I reviewed the  following data within the electronic MEDICAL RECORD NUMBER History obtained from family if available, nursing notes, old chart and ekg, as well as notes from prior ED visits. Patient presented for generalized pain with chills and sore throat, we will assess with labs and imaging as indicated at this time.   Procedures ____________________________________________   LABS (pertinent positives/negatives)  Labs Reviewed  CBC WITH DIFFERENTIAL/PLATELET - Abnormal; Notable for the following components:      Result Value   WBC 15.1 (*)    RBC 4.22 (*)    MCH 34.6 (*)    Neutro Abs 10.9 (*)    Monocytes Absolute 1.7 (*)    All other components within normal limits  COMPREHENSIVE METABOLIC PANEL - Abnormal; Notable for the following components:   Calcium 8.7 (*)    All other components within normal limits  URINALYSIS, COMPLETE (UACMP) WITH MICROSCOPIC - Abnormal; Notable for the following components:   Color, Urine YELLOW (*)    APPearance CLEAR (*)    Hgb urine dipstick MODERATE (*)    All other components within normal limits  TROPONIN I    RADIOLOGY Images were viewed by me  Chest x-ray IMPRESSION: Mild chronic bronchitic-smoking related changes. Slightly increased interstitial markings in both lungs may reflect subsegmental atelectasis or acute bronchitis. No objective evidence of CHF. ____________________________________________  DIFFERENTIAL DIAGNOSIS   Postoperative pain, dehydration, electrolyte abnormality, UTI, pneumonia  FINAL ASSESSMENT AND PLAN  Postoperative pain, myalgias   Plan: The patient had presented for generalized pain. Patient's labs did reveal leukocytosis. Patient's imaging revealed bronchitic findings.  Urine revealed likely catheterization but otherwise was clear.  His symptoms are very nonspecific, I will place him on doxycycline with close outpatient follow-up.   Ulice Dash, MD   Note: This note was generated in part or whole with voice  recognition software. Voice recognition is usually quite accurate but there are transcription errors that can and very often do occur. I apologize for any typographical errors that were not detected and corrected.     Emily Filbert, MD 07/08/17 [phone]

## 2017-07-08 NOTE — ED Notes (Signed)
Pt given urinal.

## 2018-03-01 ENCOUNTER — Emergency Department: Payer: Self-pay

## 2018-03-01 ENCOUNTER — Emergency Department
Admission: EM | Admit: 2018-03-01 | Discharge: 2018-03-01 | Disposition: A | Discharge status: Home / Self Care | Payer: Self-pay | Attending: Emergency Medicine | Admitting: Emergency Medicine

## 2018-03-01 ENCOUNTER — Other Ambulatory Visit: Payer: Self-pay

## 2018-03-01 ENCOUNTER — Encounter: Payer: Self-pay | Admitting: Emergency Medicine

## 2018-03-01 DIAGNOSIS — Z7982 Long term (current) use of aspirin: Secondary | ICD-10-CM | POA: Insufficient documentation

## 2018-03-01 DIAGNOSIS — R51 Headache: Secondary | ICD-10-CM | POA: Insufficient documentation

## 2018-03-01 DIAGNOSIS — R519 Headache, unspecified: Secondary | ICD-10-CM

## 2018-03-01 DIAGNOSIS — F17210 Nicotine dependence, cigarettes, uncomplicated: Secondary | ICD-10-CM | POA: Insufficient documentation

## 2018-03-01 LAB — BASIC METABOLIC PANEL
ANION GAP: 8 (ref 5–15)
BUN: 16 mg/dL (ref 6–20)
CALCIUM: 8.8 mg/dL — AB (ref 8.9–10.3)
CO2: 25 mmol/L (ref 22–32)
Chloride: 105 mmol/L (ref 98–111)
Creatinine, Ser: 0.85 mg/dL (ref 0.61–1.24)
GLUCOSE: 114 mg/dL — AB (ref 70–99)
POTASSIUM: 3.5 mmol/L (ref 3.5–5.1)
Sodium: 138 mmol/L (ref 135–145)

## 2018-03-01 LAB — CBC WITH DIFFERENTIAL/PLATELET
ABS IMMATURE GRANULOCYTES: 0.14 10*3/uL — AB (ref 0.00–0.07)
BASOS ABS: 0.1 10*3/uL (ref 0.0–0.1)
BASOS PCT: 1 %
Eosinophils Absolute: 0.1 10*3/uL (ref 0.0–0.5)
Eosinophils Relative: 1 %
HCT: 45.3 % (ref 39.0–52.0)
Hemoglobin: 15.1 g/dL (ref 13.0–17.0)
IMMATURE GRANULOCYTES: 1 %
Lymphocytes Relative: 28 %
Lymphs Abs: 2.8 10*3/uL (ref 0.7–4.0)
MCH: 32.3 pg (ref 26.0–34.0)
MCHC: 33.3 g/dL (ref 30.0–36.0)
MCV: 96.8 fL (ref 80.0–100.0)
MONOS PCT: 12 %
Monocytes Absolute: 1.2 10*3/uL — ABNORMAL HIGH (ref 0.1–1.0)
NEUTROS ABS: 5.6 10*3/uL (ref 1.7–7.7)
NEUTROS PCT: 57 %
NRBC: 0 % (ref 0.0–0.2)
PLATELETS: 383 10*3/uL (ref 150–400)
RBC: 4.68 MIL/uL (ref 4.22–5.81)
RDW: 12.2 % (ref 11.5–15.5)
WBC: 10 10*3/uL (ref 4.0–10.5)

## 2018-03-01 MED ORDER — PROCHLORPERAZINE EDISYLATE 10 MG/2ML IJ SOLN
10.0000 mg | Freq: Once | INTRAMUSCULAR | Status: AC
  Administered 2018-03-01: 10 mg via INTRAVENOUS
  Filled 2018-03-01: qty 2

## 2018-03-01 MED ORDER — SODIUM CHLORIDE 0.9 % IV BOLUS
1000.0000 mL | Freq: Once | INTRAVENOUS | Status: AC
  Administered 2018-03-01: 1000 mL via INTRAVENOUS

## 2018-03-01 NOTE — ED Provider Notes (Signed)
[facility] Emergency Department Provider Note   ____________________________________________   I have reviewed the triage vital signs and the nursing notes.   HISTORY  Chief Complaint Headache   History limited by: Language [facility] Interpreter utilized   HPI Joel Lutz is a 63 y.o. male who presents to the emergency department today because of concerns for headache.  He states it started 2 days ago.  Is located primarily in the back of his head although comes up to the top.  He describes it as pressure-like.  Has been constant slightly worsening.  Patient denies any trauma to the head before the pain started.  He has not had any nausea or vomiting.  He denies any vision change.  He denies history of headaches.  He does state for the past 3 weeks he has been dealing with cold-like symptoms.  He continues to have a cough.  Feels like he has had some fevers with this.  Tried over-the-counter cough medicine without any relief for his cough.   Per medical record review patient has a history of pre-diabetes  Past Medical History:  Diagnosis Date  . Pre-diabetes     There are no active problems to display for this patient.   Past Surgical History:  Procedure Laterality Date  . CARPAL TUNNEL RELEASE Right 2014  . KNEE ARTHROSCOPY WITH MEDIAL MENISECTOMY Right 07/07/2017   Procedure: KNEE ARTHROSCOPY WITH MEDIAL MENISECTOMY;  Surgeon: Juanell Fairly, MD;  Location: [facility] ORS;  Service: Orthopedics;  Laterality: Right;  . MENISCUS REPAIR Left 1995    Prior to Admission medications   Medication Sig Start Date End Date Taking? Authorizing Provider  aspirin EC 325 MG tablet Take 1 tablet (325 mg total) by mouth daily. 07/07/17   Juanell Fairly, MD  doxycycline (VIBRAMYCIN) 100 MG capsule Take 1 capsule (100 mg total) by mouth 2 (two) times daily. 07/08/17   Emily Filbert, MD  HYDROcodone-acetaminophen (NORCO) 5-325 MG tablet Take  1 tablet by mouth every 6 (six) hours as needed for moderate pain. MAXIMUM TOTAL ACETAMINOPHEN DOSE IS 4000 MG PER DAY 07/07/17   Juanell Fairly, MD  ondansetron (ZOFRAN) 4 MG tablet Take 1 tablet (4 mg total) by mouth every 8 (eight) hours as needed for nausea or vomiting. 07/07/17   Juanell Fairly, MD    Allergies Patient has no known allergies.  No family history on file.  Social History Social History   Tobacco Use  . Smoking status: Current Every Day Smoker    Packs/day: 0.25    Types: Cigarettes  . Smokeless tobacco: Never Used  Substance Use Topics  . Alcohol use: Yes    Frequency: Never    Comment: occasional beer  . Drug use: Never    Review of Systems Constitutional: No fever/chills Eyes: No visual changes. ENT: No sore throat. Cardiovascular: Denies chest pain. Respiratory: Positive for cough. Gastrointestinal: No abdominal pain.  No nausea, no vomiting.  No diarrhea.   Genitourinary: Negative for dysuria. Musculoskeletal: Negative for back pain. Skin: Negative for rash. Neurological: Positive for headache ____________________________________________   PHYSICAL EXAM:  VITAL SIGNS: ED Triage Vitals  Enc Vitals Group     BP 03/01/18 0919 128/68     Pulse Rate 03/01/18 0919 77     Resp 03/01/18 0919 16     Temp 03/01/18 0919 98.2 F (36.8 C)     Temp Source 03/01/18 0919 Oral     SpO2 03/01/18 0919 98 %     Weight  03/01/18 0920 145 lb (65.8 kg)     Height 03/01/18 0920 5\' 5"  (1.651 m)     Head Circumference --      Peak Flow --      Pain Score 03/01/18 0919 9   Constitutional: Alert and oriented.  Eyes: Conjunctivae are normal.  ENT      Head: Normocephalic and atraumatic.      Nose: No congestion/rhinnorhea.      Mouth/Throat: Mucous membranes are moist.      Neck: No stridor. Hematological/Lymphatic/Immunilogical: No cervical lymphadenopathy. Cardiovascular: Normal rate, regular rhythm.  No murmurs, rubs, or gallops.  Respiratory: Normal  respiratory effort without tachypnea nor retractions. Breath sounds are clear and equal bilaterally. No wheezes/rales/rhonchi. Gastrointestinal: Soft and non tender. No rebound. No guarding.  Genitourinary: Deferred Musculoskeletal: Normal range of motion in all extremities. No lower extremity edema. Neurologic:  Normal speech and language. No gross focal neurologic deficits are appreciated.  Skin:  Skin is warm, dry and intact. No rash noted. Psychiatric: Mood and affect are normal. Speech and behavior are normal. Patient exhibits appropriate insight and judgment.  ____________________________________________    LABS (pertinent positives/negatives)  BMP wnl except glu 114, ca 8.8 CBC wbc 10.0, hgb 15.1, plt 383  ____________________________________________   EKG  None  ____________________________________________    RADIOLOGY  CXR No acute findings  CT head No acute abnormality  ____________________________________________   PROCEDURES  Procedures  ____________________________________________   INITIAL IMPRESSION / ASSESSMENT AND PLAN / ED COURSE  Pertinent labs & imaging results that were available during my care of the patient were reviewed by me and considered in my medical decision making (see chart for details).   Patient presented to the emergency department today because of concerns for headache and dizziness.  Differential would include intracranial bleed, migraines, tension headache amongst other etiologies.  Patient also complaining of some cough.  Patient's work-up here without concerning findings.  He did feel better after medications and IV fluids.  ____________________________________________   FINAL CLINICAL IMPRESSION(S) / ED DIAGNOSES  Final diagnoses:  Bad headache     Note: This dictation was prepared with Dragon dictation. Any transcriptional errors that result from this process are unintentional     Phineas Semen, MD 03/01/18  1317

## 2018-03-01 NOTE — ED Notes (Signed)
Pt ambulatory with steady gait noted. No complaints or distress noted.

## 2018-03-01 NOTE — ED Triage Notes (Signed)
Patient reports headache across the back of his head since Saturday. Was diagnosed with the flu 2 weeks ago and reports he has not felt good since then. Also reports mild dizziness. Denies any visual disturbance.

## 2018-03-01 NOTE — Discharge Instructions (Addendum)
Please seek medical attention for any high fevers, chest pain, shortness of breath, change in behavior, persistent vomiting, bloody stool or any other new or concerning symptoms.  

## 2018-03-01 NOTE — ED Notes (Signed)
Interpreter at bedside.

## 2018-08-29 ENCOUNTER — Other Ambulatory Visit: Payer: Self-pay

## 2018-08-29 ENCOUNTER — Emergency Department: Payer: HRSA Program

## 2018-08-29 ENCOUNTER — Encounter: Payer: Self-pay | Admitting: Emergency Medicine

## 2018-08-29 ENCOUNTER — Emergency Department
Admission: EM | Admit: 2018-08-29 | Discharge: 2018-08-29 | Disposition: A | Discharge status: Home / Self Care | Payer: HRSA Program | Attending: Emergency Medicine | Admitting: Emergency Medicine

## 2018-08-29 DIAGNOSIS — R05 Cough: Secondary | ICD-10-CM | POA: Diagnosis present

## 2018-08-29 DIAGNOSIS — F17210 Nicotine dependence, cigarettes, uncomplicated: Secondary | ICD-10-CM | POA: Diagnosis not present

## 2018-08-29 DIAGNOSIS — U071 COVID-19: Secondary | ICD-10-CM | POA: Insufficient documentation

## 2018-08-29 DIAGNOSIS — R059 Cough, unspecified: Secondary | ICD-10-CM

## 2018-08-29 DIAGNOSIS — Z7982 Long term (current) use of aspirin: Secondary | ICD-10-CM | POA: Insufficient documentation

## 2018-08-29 NOTE — ED Triage Notes (Signed)
Pt to ED with c/o of HA and cough that has been ongoing for approx 1 week. Pt states nausea but denies V/D. Pt states fever at home but afebrile in triage. Pt states daughter in law has also had cough

## 2018-08-29 NOTE — Discharge Instructions (Signed)
Remain at home until you receive the information about your COVID test.  Tylenol or ibuprofen as needed for body aches, fever, headache.  Increase fluids.  Stay at home and also prevent people from coming to your home until you have received information about your test.

## 2018-08-29 NOTE — ED Notes (Signed)
Patient's discharge and follow up information reviewed with patient by ED nursing staff and patient given the opportunity to ask questions pertaining to ED visit and discharge plan of care. Patient advised that should symptoms not continue to improve, resolve entirely, or should new symptoms develop then a follow up visit with their PCP or a return visit to the ED may be warranted. Patient verbalized consent and understanding of discharge plan of care including potential need for further evaluation. Patient discharged in stable condition per attending ED physician on duty.   Discharge reviewed with medical interpreter [facility].
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
## 2018-08-31 ENCOUNTER — Telehealth: Payer: Self-pay | Admitting: Emergency Medicine

## 2018-08-31 NOTE — Telephone Encounter (Addendum)
Called pateint with [facility] intepereter [phone] to inform patient of positive covid 19 test result and assure he understands cdc guidlelines.  Left message with number for interpretter services to call back.

## 2018-09-01 LAB — NOVEL CORONAVIRUS, NAA (HOSP ORDER, SEND-OUT TO REF LAB; TAT 18-24 HRS): SARS-CoV-2, NAA: DETECTED — AB

## 2018-11-16 IMAGING — CR DG CHEST 2V
1 series · 2 of 2 positions shown · non-contrast
Comparison: PA and lateral chest x-ray June 08, 2017

CLINICAL DATA: Generalized body aches, headache, and difficulty
ambulating. Patient underwent knee surgery yesterday and awakened
today with the symptoms.

EXAM:
CHEST - 2 VIEW

[Series 1: dg chest 2 view · 0.14mm/px · 2 of 2 slices shown]
[im 1/2]
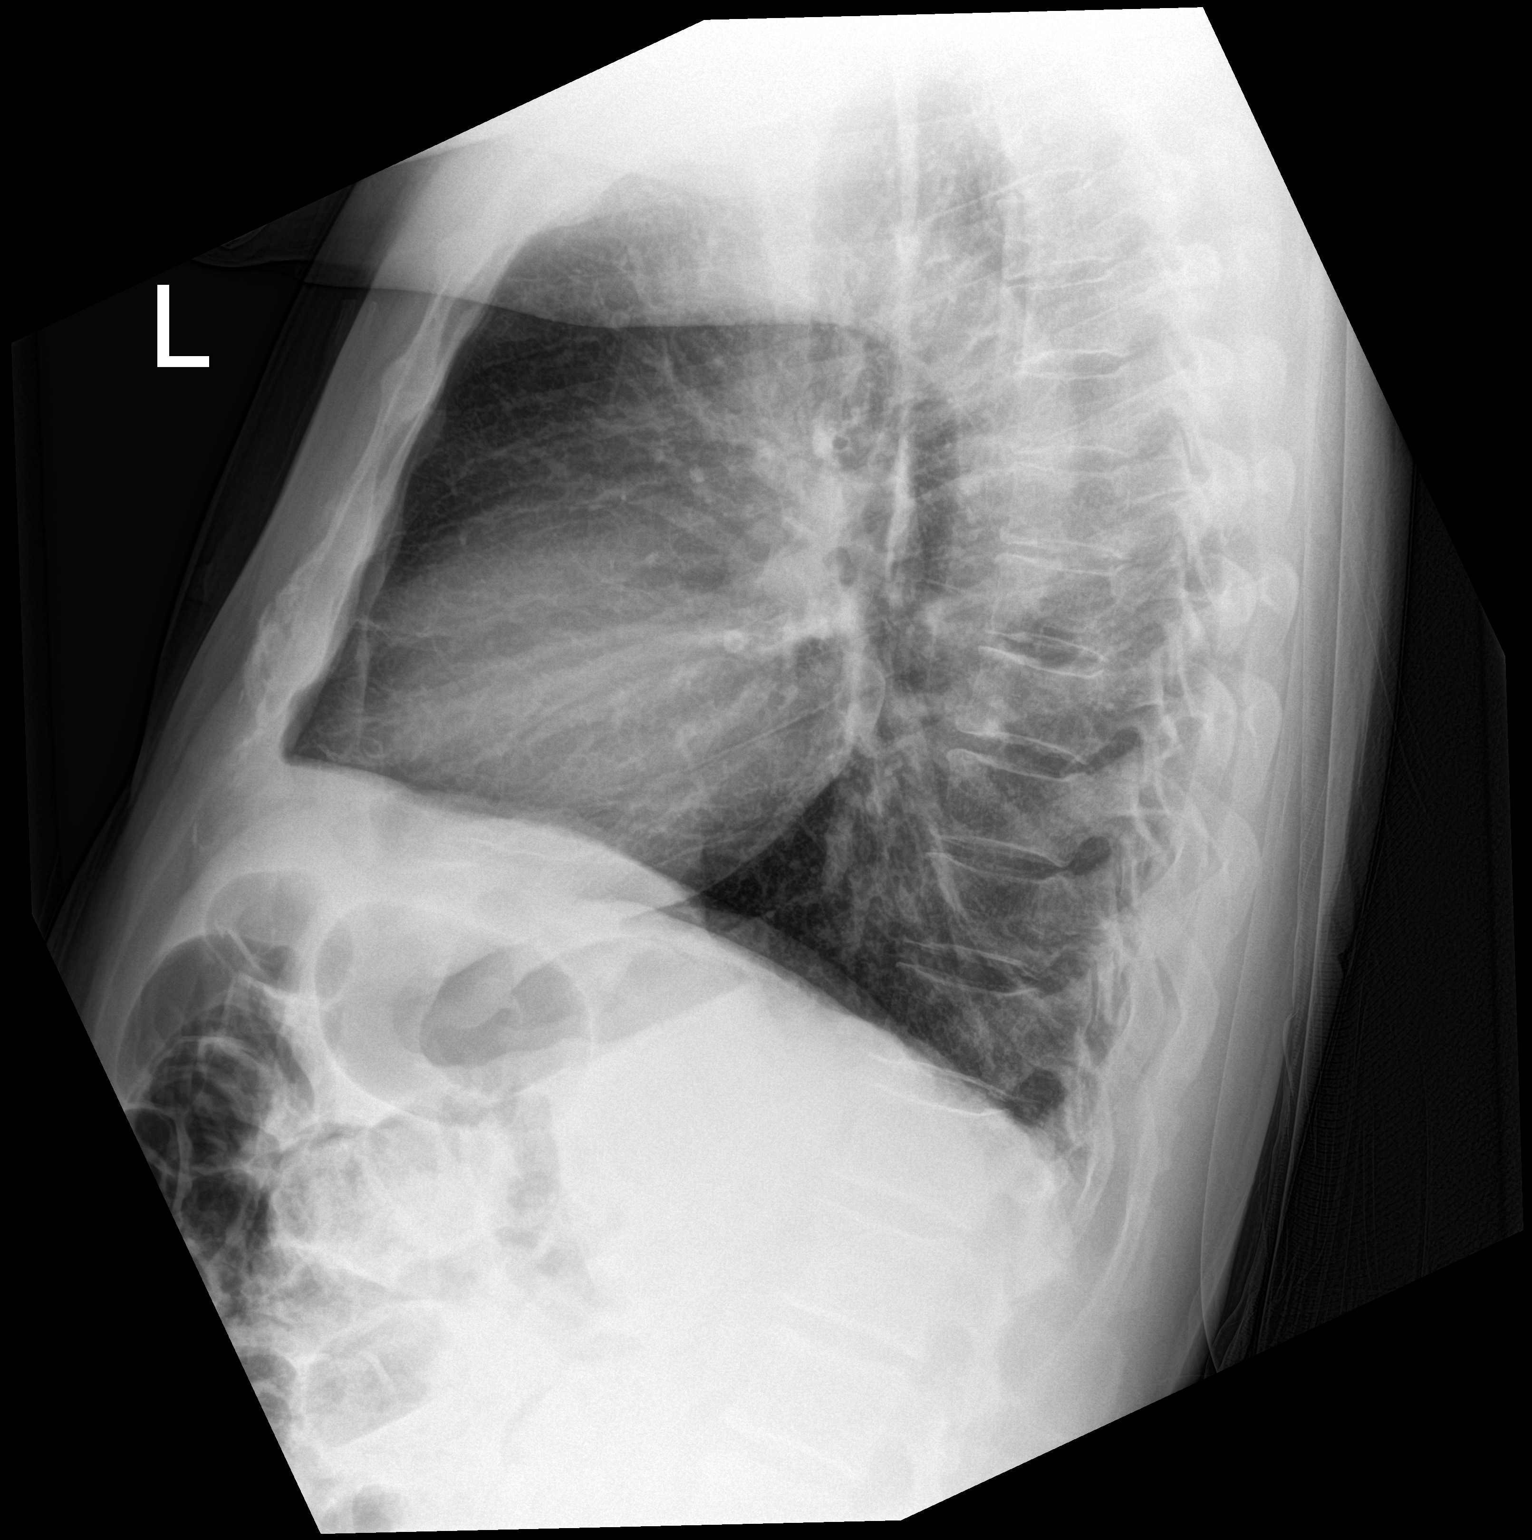
[im 2/2]
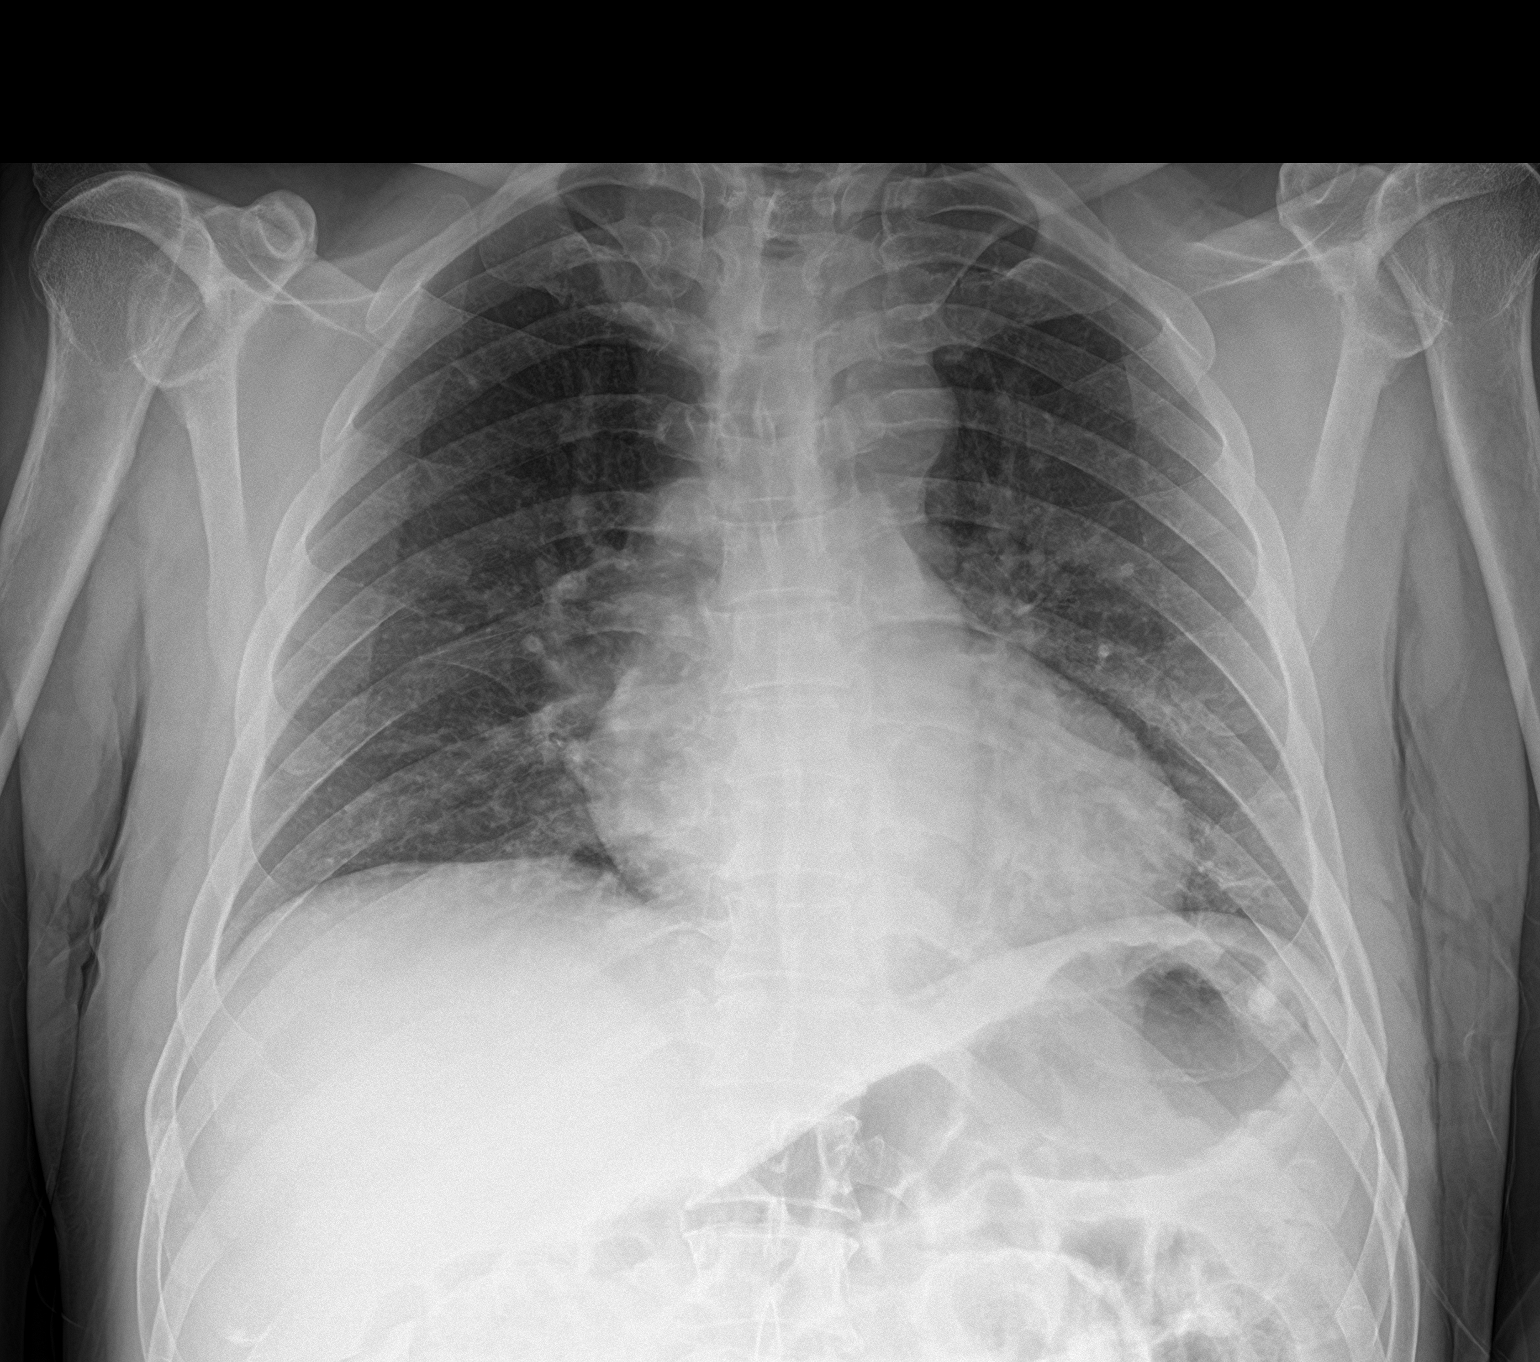

[2 of 2 positions shown; findings below may reference images not displayed]

FINDINGS: The lungs are less well expanded today. There is no focal
infiltrate. The interstitial markings are mildly increased. There is
no pleural effusion or pneumothorax. The heart is top-normal in
size. The pulmonary vascularity is not engorged. There is
calcification in the wall of the aortic arch.
IMPRESSION: Mild chronic bronchitic-smoking related changes. Slightly increased
interstitial markings in both lungs may reflect subsegmental
atelectasis or acute bronchitis. No objective evidence of CHF.

## 2019-11-14 ENCOUNTER — Emergency Department: Payer: Self-pay

## 2019-11-14 ENCOUNTER — Other Ambulatory Visit: Payer: Self-pay

## 2019-11-14 ENCOUNTER — Encounter: Payer: Self-pay | Admitting: Radiology

## 2019-11-14 ENCOUNTER — Emergency Department
Admission: EM | Admit: 2019-11-14 | Discharge: 2019-11-14 | Disposition: A | Discharge status: Home / Self Care | Payer: Self-pay | Attending: Emergency Medicine | Admitting: Emergency Medicine

## 2019-11-14 DIAGNOSIS — M542 Cervicalgia: Secondary | ICD-10-CM | POA: Insufficient documentation

## 2019-11-14 DIAGNOSIS — S3991XA Unspecified injury of abdomen, initial encounter: Secondary | ICD-10-CM

## 2019-11-14 DIAGNOSIS — F10929 Alcohol use, unspecified with intoxication, unspecified: Secondary | ICD-10-CM | POA: Insufficient documentation

## 2019-11-14 DIAGNOSIS — F17210 Nicotine dependence, cigarettes, uncomplicated: Secondary | ICD-10-CM | POA: Insufficient documentation

## 2019-11-14 DIAGNOSIS — S299XXA Unspecified injury of thorax, initial encounter: Secondary | ICD-10-CM

## 2019-11-14 DIAGNOSIS — Z20822 Contact with and (suspected) exposure to covid-19: Secondary | ICD-10-CM | POA: Insufficient documentation

## 2019-11-14 DIAGNOSIS — Z7982 Long term (current) use of aspirin: Secondary | ICD-10-CM | POA: Insufficient documentation

## 2019-11-14 LAB — CBC WITH DIFFERENTIAL/PLATELET
Abs Immature Granulocytes: 0.03 10*3/uL (ref 0.00–0.07)
Basophils Absolute: 0.1 10*3/uL (ref 0.0–0.1)
Basophils Relative: 1 %
Eosinophils Absolute: 0.1 10*3/uL (ref 0.0–0.5)
Eosinophils Relative: 1 %
HCT: 43 % (ref 39.0–52.0)
Hemoglobin: 14.6 g/dL (ref 13.0–17.0)
Immature Granulocytes: 0 %
Lymphocytes Relative: 33 %
Lymphs Abs: 2.8 10*3/uL (ref 0.7–4.0)
MCH: 33.6 pg (ref 26.0–34.0)
MCHC: 34 g/dL (ref 30.0–36.0)
MCV: 98.9 fL (ref 80.0–100.0)
Monocytes Absolute: 1 10*3/uL (ref 0.1–1.0)
Monocytes Relative: 11 %
Neutro Abs: 4.5 10*3/uL (ref 1.7–7.7)
Neutrophils Relative %: 54 %
Platelets: 287 10*3/uL (ref 150–400)
RBC: 4.35 MIL/uL (ref 4.22–5.81)
RDW: 12.9 % (ref 11.5–15.5)
WBC: 8.4 10*3/uL (ref 4.0–10.5)
nRBC: 0 % (ref 0.0–0.2)

## 2019-11-14 LAB — PROTIME-INR
INR: 0.9 (ref 0.8–1.2)
Prothrombin Time: 11.9 seconds (ref 11.4–15.2)

## 2019-11-14 LAB — COMPREHENSIVE METABOLIC PANEL
ALT: 18 U/L (ref 0–44)
AST: 30 U/L (ref 15–41)
Albumin: 4 g/dL (ref 3.5–5.0)
Alkaline Phosphatase: 54 U/L (ref 38–126)
Anion gap: 10 (ref 5–15)
BUN: 8 mg/dL (ref 8–23)
CO2: 22 mmol/L (ref 22–32)
Calcium: 8.4 mg/dL — ABNORMAL LOW (ref 8.9–10.3)
Chloride: 113 mmol/L — ABNORMAL HIGH (ref 98–111)
Creatinine, Ser: 0.63 mg/dL (ref 0.61–1.24)
GFR calc Af Amer: >60 mL/min (ref >60)
GFR calc non Af Amer: >60 mL/min (ref >60)
Glucose, Bld: 99 mg/dL (ref 70–99)
Potassium: 3.7 mmol/L (ref 3.5–5.1)
Sodium: 145 mmol/L (ref 135–145)
Total Bilirubin: 0.6 mg/dL (ref 0.3–1.2)
Total Protein: 7.4 g/dL (ref 6.5–8.1)

## 2019-11-14 LAB — LIPASE, BLOOD: Lipase: 58 U/L — ABNORMAL HIGH (ref 11–51)

## 2019-11-14 LAB — ETHANOL: Alcohol, Ethyl (B): 188 mg/dL — ABNORMAL HIGH (ref <10)

## 2019-11-14 LAB — RESPIRATORY PANEL BY RT PCR (FLU A&B, COVID)
Influenza A by PCR: NEGATIVE
Influenza B by PCR: NEGATIVE
SARS Coronavirus 2 by RT PCR: NEGATIVE

## 2019-11-14 MED ORDER — SODIUM CHLORIDE 0.9 % IV BOLUS
1000.0000 mL | Freq: Once | INTRAVENOUS | Status: AC
  Administered 2019-11-14: 1000 mL via INTRAVENOUS

## 2019-11-14 MED ORDER — ONDANSETRON HCL 4 MG/2ML IJ SOLN
4.0000 mg | INTRAMUSCULAR | Status: AC
  Administered 2019-11-14: 4 mg via INTRAVENOUS
  Filled 2019-11-14: qty 2

## 2019-11-14 MED ORDER — MORPHINE SULFATE (PF) 4 MG/ML IV SOLN
4.0000 mg | Freq: Once | INTRAVENOUS | Status: AC
  Administered 2019-11-14: 4 mg via INTRAVENOUS
  Filled 2019-11-14: qty 1

## 2019-11-14 MED ORDER — IOHEXOL 350 MG/ML SOLN
100.0000 mL | Freq: Once | INTRAVENOUS | Status: AC | PRN
  Administered 2019-11-14: 100 mL via INTRAVENOUS

## 2019-11-14 NOTE — ED Notes (Signed)
C-collar discontinued by York Cerise MD.
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
## 2019-11-14 NOTE — ED Notes (Signed)
Patient attempting to get out of bed again, gait still unsteady, assisted by this RN to bathroom. Patient returned to bed by this RN.

## 2019-11-14 NOTE — ED Notes (Signed)
Interpreter at bedside and spoke with this RN and patient, pt states he has no one to come get him and no way to get home, pt states if we call him a cab he can pay for it when he gets home. This RN discussed with Dr. Vicente Males regarding patient going home via cab. Per Dr. Vicente Males, okay to put patient in a cab and send home if patient can pay when he gets home. Pt currently visualized with steady gait ambulatory to bathroom at this time.

## 2019-11-14 NOTE — ED Notes (Signed)
Pt visualized ambulatory back to bed with steady gait from bathroom, pt laid back down at this time and covered himself back up with blanket.
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
## 2019-11-14 NOTE — ED Notes (Signed)
Patient attempting to get out of bed, redirected back into bed by this RN. Patient gait unsteady.

## 2019-11-14 NOTE — ED Notes (Signed)
Pt left ED before signed departure available.
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
